# Patient Record
Sex: Male | Born: 1980 | Race: Black or African American | Hispanic: No | Marital: Single | Smoking: Never smoker
Health system: Southern US, Community
[De-identification: ages and names within clinical notes are randomized; demographics above are authoritative.]

## PROBLEM LIST (undated history)

## (undated) DIAGNOSIS — A549 Gonococcal infection, unspecified: Secondary | ICD-10-CM

## (undated) DIAGNOSIS — J45909 Unspecified asthma, uncomplicated: Secondary | ICD-10-CM

---

## 2005-01-08 ENCOUNTER — Emergency Department (HOSPITAL_COMMUNITY): Admission: EM | Admit: 2005-01-08 | Discharge: 2005-01-08 | Payer: Self-pay | Admitting: Emergency Medicine

## 2010-03-18 ENCOUNTER — Emergency Department (HOSPITAL_COMMUNITY): Admission: EM | Admit: 2010-03-18 | Discharge: 2010-03-18 | Payer: Self-pay | Admitting: Family Medicine

## 2010-08-31 LAB — GC/CHLAMYDIA PROBE AMP, GENITAL: GC Probe Amp, Genital: POSITIVE — AB

## 2012-07-04 ENCOUNTER — Encounter (HOSPITAL_COMMUNITY): Payer: Self-pay | Admitting: Emergency Medicine

## 2012-07-04 ENCOUNTER — Emergency Department (INDEPENDENT_AMBULATORY_CARE_PROVIDER_SITE_OTHER)
Admission: EM | Admit: 2012-07-04 | Discharge: 2012-07-04 | Disposition: A | Discharge status: Home / Self Care | Payer: Self-pay | Source: Home / Self Care | Attending: Family Medicine | Admitting: Family Medicine

## 2012-07-04 ENCOUNTER — Other Ambulatory Visit (HOSPITAL_COMMUNITY)
Admission: RE | Admit: 2012-07-04 | Discharge: 2012-07-04 | Disposition: A | Discharge status: Home / Self Care | Payer: Self-pay | Source: Ambulatory Visit | Attending: Family Medicine | Admitting: Family Medicine

## 2012-07-04 DIAGNOSIS — Z113 Encounter for screening for infections with a predominantly sexual mode of transmission: Secondary | ICD-10-CM | POA: Insufficient documentation

## 2012-07-04 DIAGNOSIS — A64 Unspecified sexually transmitted disease: Secondary | ICD-10-CM

## 2012-07-04 HISTORY — DX: Unspecified asthma, uncomplicated: J45.909

## 2012-07-04 HISTORY — DX: Gonococcal infection, unspecified: A54.9

## 2012-07-04 LAB — POCT URINALYSIS DIP (DEVICE)
Bilirubin Urine: NEGATIVE
Glucose, UA: NEGATIVE mg/dL
Hgb urine dipstick: NEGATIVE
Ketones, ur: NEGATIVE mg/dL
Leukocytes, UA: NEGATIVE
pH: 6.5 (ref 5.0–8.0)

## 2012-07-04 MED ORDER — CEFTRIAXONE SODIUM 250 MG IJ SOLR
INTRAMUSCULAR | Status: AC
  Filled 2012-07-04: qty 250

## 2012-07-04 MED ORDER — CEFTRIAXONE SODIUM 1 G IJ SOLR
250.0000 mg | Freq: Once | INTRAMUSCULAR | Status: AC
  Administered 2012-07-04: 250 mg via INTRAMUSCULAR

## 2012-07-04 MED ORDER — LIDOCAINE HCL (PF) 1 % IJ SOLN
INTRAMUSCULAR | Status: AC
  Filled 2012-07-04: qty 5

## 2012-07-04 MED ORDER — AZITHROMYCIN 250 MG PO TABS
1000.0000 mg | ORAL_TABLET | Freq: Once | ORAL | Status: AC
  Administered 2012-07-04: 1000 mg via ORAL

## 2012-07-04 MED ORDER — AZITHROMYCIN 250 MG PO TABS
ORAL_TABLET | ORAL | Status: AC
  Filled 2012-07-04: qty 4

## 2012-07-04 NOTE — ED Notes (Signed)
Reports tingling with urination, denies sores or blisters.  Patient reports history of gonorrhea.

## 2012-07-04 NOTE — ED Provider Notes (Signed)
History     CSN: [id number]  Arrival date & time 07/04/12  1044   First MD Initiated Contact with Patient 07/04/12 1123      Chief Complaint  Patient presents with  . Exposure to STD    (Consider location/radiation/quality/duration/timing/severity/associated sxs/prior treatment) Patient is a 32 y.o. male presenting with STD exposure. The history is provided by the patient.  Exposure to STD This is a new problem. The current episode started yesterday (exposure approx 1 week ago, no condom use.). The problem has been gradually worsening (state sx and progression similar to gc episode he has had prev.). Pertinent negatives include no abdominal pain.    Past Medical History  Diagnosis Date  . Gonorrhea   . Asthma     History reviewed. No pertinent past surgical history.  No family history on file.  History  Substance Use Topics  . Smoking status: Never Smoker   . Smokeless tobacco: Not on file  . Alcohol Use: Yes      Review of Systems  Constitutional: Negative.   Gastrointestinal: Negative.  Negative for abdominal pain.  Genitourinary: Positive for dysuria. Negative for discharge, penile swelling, scrotal swelling, penile pain and testicular pain.    Allergies  Review of patient's allergies indicates no known allergies.  Home Medications  No current outpatient prescriptions on file.  BP 145/95  Pulse 80  Temp 98.6 F (37 C) (Oral)  Resp 18  SpO2 99%  Physical Exam  Nursing note and vitals reviewed. Constitutional: He is oriented to person, place, and time. He appears well-developed and well-nourished.  Abdominal: Soft. Bowel sounds are normal.  Genitourinary: Penis normal. No penile tenderness.  Neurological: He is alert and oriented to person, place, and time.  Skin: Skin is warm and dry.    ED Course  Procedures (including critical care time)   Labs Reviewed  POCT URINALYSIS DIP (DEVICE)  URINE CYTOLOGY ANCILLARY ONLY   No results  found.   1. STD (male)       MDM          Linna Hoff, MD 07/04/12 [id number]

## 2012-07-04 NOTE — ED Notes (Signed)
Patient understands the delay prior to discharge post antibiotic injection

## 2012-07-30 ENCOUNTER — Telehealth (HOSPITAL_COMMUNITY): Payer: Self-pay | Admitting: *Deleted

## 2012-07-30 NOTE — ED Notes (Addendum)
GC neg., Chlamydia pos., Affirm: Trich and BV neg.,   Pt. adequately treated with Zithromax and Rocephin.  I called pt. and left a message. He called back.  Pt. verified x 2 and given results.  Pt. told he was adequately treated.  Pt. instructed to notify his partner, no sex for 1 week and to practice safe sex. Pt. told they can get HIV testing at the [facility]. STD clinic, by appointment.  Pt. voiced understanding.  DHHS form completed and faxed to the [facility]. Vassie Moselle 07/30/2012

## 2012-08-31 ENCOUNTER — Emergency Department (HOSPITAL_COMMUNITY)
Admission: EM | Admit: 2012-08-31 | Discharge: 2012-08-31 | Disposition: A | Discharge status: Home / Self Care | Payer: BC Managed Care – PPO | Source: Home / Self Care | Attending: Family Medicine | Admitting: Family Medicine

## 2012-08-31 ENCOUNTER — Encounter (HOSPITAL_COMMUNITY): Payer: Self-pay

## 2012-08-31 DIAGNOSIS — A64 Unspecified sexually transmitted disease: Secondary | ICD-10-CM

## 2012-08-31 MED ORDER — CEFTRIAXONE SODIUM 250 MG IJ SOLR
250.0000 mg | Freq: Once | INTRAMUSCULAR | Status: AC
  Administered 2012-08-31: 250 mg via INTRAMUSCULAR

## 2012-08-31 MED ORDER — LIDOCAINE HCL (PF) 1 % IJ SOLN
INTRAMUSCULAR | Status: AC
  Filled 2012-08-31: qty 5

## 2012-08-31 MED ORDER — CEFTRIAXONE SODIUM 250 MG IJ SOLR
INTRAMUSCULAR | Status: AC
  Filled 2012-08-31: qty 250

## 2012-08-31 MED ORDER — DOXYCYCLINE HYCLATE 100 MG PO CAPS: 100.0000 mg | ORAL_CAPSULE | Freq: Two times a day (BID) | ORAL | Status: DC

## 2012-08-31 NOTE — ED Notes (Addendum)
Here for STD check; partner has informed him of chlamydia; here for testing and treatment; NAD

## 2012-08-31 NOTE — ED Provider Notes (Signed)
History     CSN: [id number]  Arrival date & time 08/31/12  1100   First MD Initiated Contact with Patient 08/31/12 1116      Chief Complaint  Patient presents with  . Exposure to STD    HPI patient is a 32 year old male who has been sexually active coming in with some dysuria. Patient was told that one of the sexual partners did have a positive chlamydia. Patient was seen back in February and did have a positive chlamydia. Patient denies any fevers or chills or any abdominal pain. Patient states that he's had some dysuria usually in the mornings and that seems to resolve. Patient denies any type of rash, any penile discharge, any fevers chills or any abnormal weight loss. Patient states that he has got a health department and started and tested for HIV and syphilis which were negative and he would decline a lab draw today.  Past Medical History  Diagnosis Date  . Gonorrhea   . Asthma     History reviewed. No pertinent past surgical history.  History reviewed. No pertinent family history.  History  Substance Use Topics  . Smoking status: Never Smoker   . Smokeless tobacco: Not on file  . Alcohol Use: Yes      Review of Systems  Allergies  Review of patient's allergies indicates no known allergies.  Home Medications   Current Outpatient Rx  Name  Route  Sig  Dispense  Refill  . doxycycline (VIBRAMYCIN) 100 MG capsule   Oral   Take 1 capsule (100 mg total) by mouth 2 (two) times daily.   28 capsule   0     BP 134/84  Pulse 90  Temp(Src) 98.6 F (37 C) (Oral)  Resp 19  SpO2 100%  Physical Exam General: No apparent distress alert and oriented x3 mood and affect normal Respiratory: Patient's speak in full sentences and does not appear short of breath Skin: Warm dry intact with no signs of infection or rash Neuro: Cranial nerves II through XII are intact, neurovascularly intact in all extremities with 2+ DTRs and 2+ pulses. General exam: No gross deformity,  testes are nontender, there is no swelling. No penile discharge. No enlarged lymph nodes. ED Course  Procedures   Labs Reviewed  URINALYSIS, DIPSTICK ONLY  URINE CYTOLOGY ANCILLARY ONLY  URINE CYTOLOGY ANCILLARY ONLY   No results found.   1. STD (male)    patient given an injection of ceftriaxone today and we'll give doxycycline. We will await labs and call him with the results as well. Patient's decline any blood draw. Patient will follow up as needed. Discussed at length about safe sex practices.    MDM          Judi Saa, DO 08/31/12 1135

## 2012-08-31 NOTE — ED Notes (Signed)
UA testing cancelled due to pt UA amt produced

## 2012-08-31 NOTE — ED Provider Notes (Signed)
Medical screening examination/treatment/procedure(s) were performed by a resident physician and as supervising physician I was immediately available for consultation/collaboration.  Margaree Sandhu, M.D.  Vianka Ertel C Maaliyah Adolph, MD 08/31/12 2039 

## 2013-04-01 ENCOUNTER — Emergency Department (HOSPITAL_COMMUNITY)
Admission: EM | Admit: 2013-04-01 | Discharge: 2013-04-01 | Disposition: A | Discharge status: Home / Self Care | Payer: BC Managed Care – PPO | Source: Home / Self Care

## 2013-04-01 ENCOUNTER — Other Ambulatory Visit (HOSPITAL_COMMUNITY)
Admission: RE | Admit: 2013-04-01 | Discharge: 2013-04-01 | Disposition: A | Discharge status: Home / Self Care | Payer: BC Managed Care – PPO | Source: Ambulatory Visit | Attending: Family Medicine | Admitting: Family Medicine

## 2013-04-01 ENCOUNTER — Encounter (HOSPITAL_COMMUNITY): Payer: Self-pay | Admitting: Emergency Medicine

## 2013-04-01 DIAGNOSIS — R1030 Lower abdominal pain, unspecified: Secondary | ICD-10-CM

## 2013-04-01 DIAGNOSIS — Z202 Contact with and (suspected) exposure to infections with a predominantly sexual mode of transmission: Secondary | ICD-10-CM

## 2013-04-01 DIAGNOSIS — R109 Unspecified abdominal pain: Secondary | ICD-10-CM

## 2013-04-01 DIAGNOSIS — Z113 Encounter for screening for infections with a predominantly sexual mode of transmission: Secondary | ICD-10-CM | POA: Insufficient documentation

## 2013-04-01 MED ORDER — CEFTRIAXONE SODIUM 250 MG IJ SOLR
INTRAMUSCULAR | Status: AC
  Filled 2013-04-01: qty 250

## 2013-04-01 MED ORDER — AZITHROMYCIN 250 MG PO TABS
ORAL_TABLET | ORAL | Status: AC
  Filled 2013-04-01: qty 4

## 2013-04-01 MED ORDER — CEFTRIAXONE SODIUM 250 MG IJ SOLR
250.0000 mg | Freq: Once | INTRAMUSCULAR | Status: AC
  Administered 2013-04-01: 250 mg via INTRAMUSCULAR

## 2013-04-01 MED ORDER — DOXYCYCLINE HYCLATE 100 MG PO CAPS: 100.0000 mg | ORAL_CAPSULE | Freq: Two times a day (BID) | ORAL | Status: DC

## 2013-04-01 MED ORDER — AZITHROMYCIN 250 MG PO TABS
1000.0000 mg | ORAL_TABLET | Freq: Every day | ORAL | Status: DC
  Administered 2013-04-01: 1000 mg via ORAL

## 2013-04-01 NOTE — ED Provider Notes (Signed)
CSN: [id number]     Arrival date & time 04/01/13  4098 History   First MD Initiated Contact with Patient 04/01/13 1008     Chief Complaint  Patient presents with  . Groin Pain   (Consider location/radiation/quality/duration/timing/severity/associated sxs/prior Treatment) HPI Comments: 32 year old male complaining of suprapubic discomfort associated with urination off and on for several months. Has a history of STDs to include Chlamydia and gonorrhea. He has been to the urgent care 4 times for similar complaints. Rarely has dysuria but the other only description is that he feels hot in the groin. He is sexually active and sometimes uses a condom and others not. Denies systemic symptoms.   Past Medical History  Diagnosis Date  . Gonorrhea   . Asthma    History reviewed. No pertinent past surgical history. History reviewed. No pertinent family history. History  Substance Use Topics  . Smoking status: Never Smoker   . Smokeless tobacco: Not on file  . Alcohol Use: Yes    Review of Systems  Constitutional: Negative.   Gastrointestinal: Negative.   Genitourinary: Positive for dysuria. Negative for urgency, frequency, hematuria, flank pain, decreased urine volume, discharge, scrotal swelling, difficulty urinating, genital sores, penile pain and testicular pain.  Skin: Negative for rash.  Neurological: Negative.     Allergies  Review of patient's allergies indicates no known allergies.  Home Medications   Current Outpatient Rx  Name  Route  Sig  Dispense  Refill  . doxycycline (VIBRAMYCIN) 100 MG capsule   Oral   Take 1 capsule (100 mg total) by mouth 2 (two) times daily.   28 capsule   0   . doxycycline (VIBRAMYCIN) 100 MG capsule   Oral   Take 1 capsule (100 mg total) by mouth 2 (two) times daily.   20 capsule   0    BP 137/84  Pulse 81  Temp(Src) 99.1 F (37.3 C) (Oral)  Resp 18  SpO2 99% Physical Exam  Nursing note and vitals reviewed. Constitutional: He is  oriented to person, place, and time. He appears well-developed and well-nourished. No distress.  Neck: Normal range of motion. Neck supple.  Pulmonary/Chest: Effort normal. No respiratory distress.  Genitourinary: Penis normal. No penile tenderness.  Normal external male genitalia. Normal phallus. No penile discharge. Bilateral testicles descended, symmetric and of normal size. No tenderness. No epididymal tenderness or swelling. No tender or enlarged inguinal lymph nodes. No visible genital skin lesions.  Neurological: He is alert and oriented to person, place, and time. He exhibits normal muscle tone.  Skin: Skin is warm and dry. No rash noted.  Psychiatric: He has a normal mood and affect.    ED Course  Procedures (including critical care time) Labs Review Labs Reviewed  URINE CYTOLOGY ANCILLARY ONLY   Imaging Review No results found.    MDM   1. Suprapubic pain, unspecified laterality   2. Possible exposure to STD      Recommend you obtain a primary care provider as it is possible. He may also followup with the health department. Rocephin 250 mg IM Azithromycin 1 g by mouth now Doxycycline 100 mg twice a day for 7 days Instructions on STD and prevention.  Hayden Rasmussen, NP 04/01/13 1124

## 2013-04-01 NOTE — ED Notes (Signed)
Pt  Reports  Pain in his  Groin area   And  Low  abd  X  2   Months   He   Ambulated  With a  Steady  gluid  Gait    He  denys  Any  Discharge  Or  Any  Lesions            He reports  He  Has       Had  Std  In  Past

## 2013-04-03 NOTE — ED Provider Notes (Signed)
Medical screening examination/treatment/procedure(s) were performed by a resident physician or non-physician practitioner and as the supervising physician I was immediately available for consultation/collaboration.  Clementeen Graham, MD   Rodolph Bong, MD 04/03/13 [phone]

## 2014-03-20 ENCOUNTER — Emergency Department (HOSPITAL_COMMUNITY)
Admission: EM | Admit: 2014-03-20 | Discharge: 2014-03-20 | Disposition: A | Discharge status: Home / Self Care | Payer: BC Managed Care – PPO | Source: Home / Self Care | Attending: Family Medicine | Admitting: Family Medicine

## 2014-03-20 ENCOUNTER — Encounter (HOSPITAL_COMMUNITY): Payer: Self-pay | Admitting: Emergency Medicine

## 2014-03-20 DIAGNOSIS — H109 Unspecified conjunctivitis: Secondary | ICD-10-CM

## 2014-03-20 DIAGNOSIS — H5711 Ocular pain, right eye: Secondary | ICD-10-CM

## 2014-03-20 MED ORDER — POLYMYXIN B-TRIMETHOPRIM 10000-0.1 UNIT/ML-% OP SOLN: 2.0000 [drp] | Freq: Four times a day (QID) | OPHTHALMIC | Status: AC

## 2014-03-20 NOTE — Discharge Instructions (Signed)
Bacterial Conjunctivitis °Bacterial conjunctivitis, commonly called pink eye, is an inflammation of the clear membrane that covers the white part of the eye (conjunctiva). The inflammation can also happen on the underside of the eyelids. The blood vessels in the conjunctiva become inflamed, causing the eye to become red or pink. Bacterial conjunctivitis may spread easily from one eye to another and from person to person (contagious).  °CAUSES  °Bacterial conjunctivitis is caused by bacteria. The bacteria may come from your own skin, your upper respiratory tract, or from someone else with bacterial conjunctivitis. °SYMPTOMS  °The normally white color of the eye or the underside of the eyelid is usually pink or red. The pink eye is usually associated with irritation, tearing, and some sensitivity to light. Bacterial conjunctivitis is often associated with a thick, yellowish discharge from the eye. The discharge may turn into a crust on the eyelids overnight, which causes your eyelids to stick together. If a discharge is present, there may also be some blurred vision in the affected eye. °DIAGNOSIS  °Bacterial conjunctivitis is diagnosed by your caregiver through an eye exam and the symptoms that you report. Your caregiver looks for changes in the surface tissues of your eyes, which may point to the specific type of conjunctivitis. A sample of any discharge may be collected on a cotton-tip swab if you have a severe case of conjunctivitis, if your cornea is affected, or if you keep getting repeat infections that do not respond to treatment. The sample will be sent to a lab to see if the inflammation is caused by a bacterial infection and to see if the infection will respond to antibiotic medicines. °TREATMENT  °· Bacterial conjunctivitis is treated with antibiotics. Antibiotic eyedrops are most often used. However, antibiotic ointments are also available. Antibiotics pills are sometimes used. Artificial tears or eye  washes may ease discomfort. °HOME CARE INSTRUCTIONS  °· To ease discomfort, apply a cool, clean washcloth to your eye for 10-20 minutes, 3-4 times a day. °· Gently wipe away any drainage from your eye with a warm, wet washcloth or a cotton ball. °· Wash your hands often with soap and water. Use paper towels to dry your hands. °· Do not share towels or washcloths. This may spread the infection. °· Change or wash your pillowcase every day. °· You should not use eye makeup until the infection is gone. °· Do not operate machinery or drive if your vision is blurred. °· Stop using contact lenses. Ask your caregiver how to sterilize or replace your contacts before using them again. This depends on the type of contact lenses that you use. °· When applying medicine to the infected eye, do not touch the edge of your eyelid with the eyedrop bottle or ointment tube. °SEEK IMMEDIATE MEDICAL CARE IF:  °· Your infection has not improved within 3 days after beginning treatment. °· You had yellow discharge from your eye and it returns. °· You have increased eye pain. °· Your eye redness is spreading. °· Your vision becomes blurred. °· You have a fever or persistent symptoms for more than 2-3 days. °· You have a fever and your symptoms suddenly get worse. °· You have facial pain, redness, or swelling. °MAKE SURE YOU:  °· Understand these instructions. °· Will watch your condition. °· Will get help right away if you are not doing well or get worse. °Document Released: 06/04/2005 Document Revised: 10/19/2013 Document Reviewed: 11/05/2011 °ExitCare® Patient Information ©2015 [employer]. This information is not intended to   replace advice given to you by your health care provider. Make sure you discuss any questions you have with your health care provider.   Use 1 day longer after symptoms improve. It is possible it may occur in left eye, ok to use in that eye if needed.

## 2014-03-20 NOTE — ED Provider Notes (Signed)
CSN: [id number]     Arrival date & time 03/20/14  1102 History   First MD Initiated Contact with Patient 03/20/14 1112     Chief Complaint  Patient presents with  . Conjunctivitis   (Consider location/radiation/quality/duration/timing/severity/associated sxs/prior Treatment) HPI Comments: Patient presents with a red irritated eye that began yesterday. His Children's Mom also notes this. HE denies change in vision, cold symptoms, fever or chills. Matted together this am.   Patient is a 33 y.o. male presenting with conjunctivitis. The history is provided by the patient.  Conjunctivitis    Past Medical History  Diagnosis Date  . Gonorrhea   . Asthma     as a child   History reviewed. No pertinent past surgical history. Family History  Problem Relation Age of Onset  . Diabetes Father   . Diabetes Other    History  Substance Use Topics  . Smoking status: Never Smoker   . Smokeless tobacco: Not on file  . Alcohol Use: Yes     Comment: 50 oz /day    Review of Systems  All other systems reviewed and are negative.   Allergies  Review of patient's allergies indicates no known allergies.  Home Medications   Prior to Admission medications   Medication Sig Start Date End Date Taking? Authorizing Provider  doxycycline (VIBRAMYCIN) 100 MG capsule Take 1 capsule (100 mg total) by mouth 2 (two) times daily. 08/31/12   Judi SaaZachary M Smith, DO  doxycycline (VIBRAMYCIN) 100 MG capsule Take 1 capsule (100 mg total) by mouth 2 (two) times daily. 04/01/13   Hayden Rasmussenavid Mabe, NP  trimethoprim-polymyxin b (POLYTRIM) ophthalmic solution Place 2 drops into the right eye every 6 (six) hours. 03/20/14 03/24/14  Riki SheerMichelle G Benedetto Ryder, PA-C   BP 129/87  Pulse 75  Temp(Src) 98.2 F (36.8 C) (Oral)  Resp 16  SpO2 98% Physical Exam  Nursing note and vitals reviewed. Constitutional: He is oriented to person, place, and time. He appears well-developed and well-nourished. No distress.  HENT:  Head:  Normocephalic and atraumatic.  Mouth/Throat: Oropharynx is clear and moist. No oropharyngeal exudate.  Eyes: Pupils are equal, round, and reactive to light. Right eye exhibits discharge. Left eye exhibits no discharge. No scleral icterus.  Right sclera injection and right beefy red conjunctiva. Clear drainage by exam  Lymphadenopathy:    He has no cervical adenopathy.  Neurological: He is alert and oriented to person, place, and time.  Skin: Skin is warm. No rash noted. He is not diaphoretic.  Psychiatric: His behavior is normal.    ED Course  Procedures (including critical care time) Labs Review Labs Reviewed - No data to display  Imaging Review No results found.   MDM   1. Conjunctivitis of right eye   2. Eye pain, right    Empiric treatment with antibiotic drops given amount of drainage this am. F/U if worsens.     Riki SheerMichelle G Amran Malter, PA-C 03/20/14 1153

## 2014-03-20 NOTE — ED Provider Notes (Signed)
Medical screening examination/treatment/procedure(s) were performed by non-physician practitioner and as supervising physician I was immediately available for consultation/collaboration.  Leslee Homeavid Laketra Bowdish, M.D.  Reuben Likesavid C Cherry Wittwer, MD 03/20/14 [phone]

## 2014-03-20 NOTE — ED Notes (Signed)
C/o pink eye OD onset 10/1. States his children's Mom has it.  Was watery drainage and occasional mild pain. No visual deficit.

## 2014-05-31 ENCOUNTER — Other Ambulatory Visit (HOSPITAL_COMMUNITY)
Admission: RE | Admit: 2014-05-31 | Discharge: 2014-05-31 | Disposition: A | Discharge status: Home / Self Care | Payer: BC Managed Care – PPO | Source: Ambulatory Visit | Attending: Family Medicine | Admitting: Family Medicine

## 2014-05-31 ENCOUNTER — Encounter (HOSPITAL_COMMUNITY): Payer: Self-pay | Admitting: Emergency Medicine

## 2014-05-31 ENCOUNTER — Emergency Department (INDEPENDENT_AMBULATORY_CARE_PROVIDER_SITE_OTHER)
Admission: EM | Admit: 2014-05-31 | Discharge: 2014-05-31 | Disposition: A | Discharge status: Home / Self Care | Payer: BC Managed Care – PPO | Source: Home / Self Care | Attending: Family Medicine | Admitting: Family Medicine

## 2014-05-31 DIAGNOSIS — Z113 Encounter for screening for infections with a predominantly sexual mode of transmission: Secondary | ICD-10-CM | POA: Diagnosis not present

## 2014-05-31 DIAGNOSIS — N342 Other urethritis: Secondary | ICD-10-CM

## 2014-05-31 MED ORDER — CEFTRIAXONE SODIUM 250 MG IJ SOLR
250.0000 mg | Freq: Once | INTRAMUSCULAR | Status: AC
  Administered 2014-05-31: 250 mg via INTRAMUSCULAR

## 2014-05-31 MED ORDER — AZITHROMYCIN 250 MG PO TABS
ORAL_TABLET | ORAL | Status: AC
  Filled 2014-05-31: qty 4

## 2014-05-31 MED ORDER — LIDOCAINE HCL (PF) 1 % IJ SOLN
INTRAMUSCULAR | Status: AC
  Filled 2014-05-31: qty 5

## 2014-05-31 MED ORDER — AZITHROMYCIN 250 MG PO TABS
1000.0000 mg | ORAL_TABLET | Freq: Once | ORAL | Status: AC
  Administered 2014-05-31: 1000 mg via ORAL

## 2014-05-31 MED ORDER — CEFTRIAXONE SODIUM 250 MG IJ SOLR
INTRAMUSCULAR | Status: AC
  Filled 2014-05-31: qty 250

## 2014-05-31 NOTE — Discharge Instructions (Signed)
Thank you for coming in today. ° °Urethritis °Urethritis is an inflammation of the tube through which urine exits your bladder (urethra).  °CAUSES °Urethritis is often caused by an infection in your urethra. The infection can be viral, like herpes. The infection can also be bacterial, like gonorrhea. °RISK FACTORS °Risk factors of urethritis include: °· Having sex without using a condom. °· Having multiple sexual partners. °· Having poor hygiene. °SIGNS AND SYMPTOMS °Symptoms of urethritis are less noticeable in women than in men. These symptoms include: °· Burning feeling when you urinate (dysuria). °· Discharge from your urethra. °· Blood in your urine (hematuria). °· Urinating more than usual. °DIAGNOSIS  °To confirm a diagnosis of urethritis, your health care provider will do the following: °· Ask about your sexual history. °· Perform a physical exam. °· Have you provide a sample of your urine for lab testing. °· Use a cotton swab to gently collect a sample from your urethra for lab testing. °TREATMENT  °It is important to treat urethritis. Depending on the cause, untreated urethritis may lead to serious genital infections and possibly infertility. Urethritis caused by a bacterial infection is treated with antibiotic medicine. All sexual partners must be treated.  °HOME CARE INSTRUCTIONS °· Do not have sex until the test results are known and treatment is completed, even if your symptoms go away before you finish treatment. °· If you were prescribed an antibiotic, finish it all even if you start to feel better. °SEEK MEDICAL CARE IF:  °· Your symptoms are not improved in 3 days. °· Your symptoms are getting worse. °· You develop abdominal pain or pelvic pain (in women). °· You develop joint pain. °· You have a fever. °SEEK IMMEDIATE MEDICAL CARE IF:  °· You have severe pain in the belly, back, or side. °· You have repeated vomiting. °MAKE SURE YOU: °· Understand these instructions. °· Will watch your  condition. °· Will get help right away if you are not doing well or get worse. °Document Released: 11/28/2000 Document Revised: 10/19/2013 Document Reviewed: 02/02/2013 °ExitCare® Patient Information ©2015 [employer]. This information is not intended to replace advice given to you by your health care provider. Make sure you discuss any questions you have with your health care provider. ° °

## 2014-05-31 NOTE — ED Provider Notes (Signed)
Adam Cross is a 33 y.o. male who presents to Urgent Care today for one day of urinary burning and tingling associated with penile discharge. Symptoms consistent with previous episodes of sexually-transmitted infection. No fevers or chills vomiting or diarrhea.   Past Medical History  Diagnosis Date  . Gonorrhea   . Asthma     as a child   History reviewed. No pertinent past surgical history. History  Substance Use Topics  . Smoking status: Never Smoker   . Smokeless tobacco: Not on file  . Alcohol Use: Yes     Comment: 75 oz /day   ROS as above Medications: Current Facility-Administered Medications  Medication Dose Route Frequency Provider Last Rate Last Dose  . azithromycin (ZITHROMAX) tablet 1,000 mg  1,000 mg Oral Once Rodolph BongEvan S Marvie Calender, MD      . cefTRIAXone (ROCEPHIN) injection 250 mg  250 mg Intramuscular Once Rodolph BongEvan S Anatasia Tino, MD       No current outpatient prescriptions on file.   No Known Allergies   Exam:  BP 117/88 mmHg  Pulse 89  Temp(Src) 97.9 F (36.6 C) (Oral)  Resp 16  SpO2 99% Gen: Well NAD Genitals:  No inguinal lymphadenopathy. Testicles are descended bilaterally and nontender. No masses. Normal-appearing circumcised penis with clear discharge.  No results found for this or any previous visit (from the past 24 hour(s)). No results found.  Assessment and Plan: 33 y.o. male with urethritis. Treatment with ceftriaxone and azithromycin. Use 1 g azithromycin orally now, and 250 mg of IM ceftriaxone now.  Urine cytology, HIV and RPR pending.  Discussed warning signs or symptoms. Please see discharge instructions. Patient expresses understanding.     Rodolph BongEvan S Janay Canan, MD 05/31/14 [phone]

## 2014-05-31 NOTE — ED Notes (Signed)
Pt states that it hurts for him to urinate. He feels burning and tingling during work.

## 2014-06-01 LAB — RPR

## 2014-06-01 LAB — URINE CYTOLOGY ANCILLARY ONLY
CHLAMYDIA, DNA PROBE: POSITIVE — AB
Neisseria Gonorrhea: POSITIVE — AB
Trichomonas: NEGATIVE

## 2014-06-01 LAB — HIV ANTIBODY (ROUTINE TESTING W REFLEX): HIV 1&2 Ab, 4th Generation: NONREACTIVE

## 2014-06-03 ENCOUNTER — Telehealth (HOSPITAL_COMMUNITY): Payer: Self-pay | Admitting: *Deleted

## 2014-06-03 NOTE — ED Notes (Signed)
GC and Chlamydia pos., Trich neg.  I called pt. and left a message and he called back.  Pt. verified x 2 and given results.  Pt. told he was adequately treated with Zithromax and Rocephin.  Pt. instructed to notify his partner, no sex for 1 week and to practice safe sex. Pt. [facility], by appointment. Pt. voiced understanding.   DHHS forms x 2 completed [facility]. Vassie MoselleYork, Erminio Nygard M 06/03/2014

## 2014-07-16 ENCOUNTER — Encounter (HOSPITAL_COMMUNITY): Payer: Self-pay | Admitting: Emergency Medicine

## 2014-07-16 ENCOUNTER — Emergency Department (INDEPENDENT_AMBULATORY_CARE_PROVIDER_SITE_OTHER)
Admission: EM | Admit: 2014-07-16 | Discharge: 2014-07-16 | Disposition: A | Discharge status: Home / Self Care | Payer: Self-pay | Source: Home / Self Care | Attending: Family Medicine | Admitting: Family Medicine

## 2014-07-16 ENCOUNTER — Other Ambulatory Visit (HOSPITAL_COMMUNITY)
Admission: RE | Admit: 2014-07-16 | Discharge: 2014-07-16 | Disposition: A | Discharge status: Home / Self Care | Payer: Self-pay | Source: Ambulatory Visit | Attending: Family Medicine | Admitting: Family Medicine

## 2014-07-16 DIAGNOSIS — N342 Other urethritis: Secondary | ICD-10-CM

## 2014-07-16 DIAGNOSIS — Z113 Encounter for screening for infections with a predominantly sexual mode of transmission: Secondary | ICD-10-CM | POA: Insufficient documentation

## 2014-07-16 MED ORDER — AZITHROMYCIN 250 MG PO TABS
1000.0000 mg | ORAL_TABLET | Freq: Once | ORAL | Status: AC
  Administered 2014-07-16: 1000 mg via ORAL

## 2014-07-16 MED ORDER — CEFTRIAXONE SODIUM 250 MG IJ SOLR
INTRAMUSCULAR | Status: AC
  Filled 2014-07-16: qty 250

## 2014-07-16 MED ORDER — AZITHROMYCIN 250 MG PO TABS
ORAL_TABLET | ORAL | Status: AC
  Filled 2014-07-16: qty 1

## 2014-07-16 MED ORDER — CEFTRIAXONE SODIUM 250 MG IJ SOLR
250.0000 mg | Freq: Once | INTRAMUSCULAR | Status: AC
  Administered 2014-07-16: 250 mg via INTRAMUSCULAR

## 2014-07-16 MED ORDER — LIDOCAINE HCL (PF) 1 % IJ SOLN
INTRAMUSCULAR | Status: AC
  Filled 2014-07-16: qty 5

## 2014-07-16 NOTE — ED Provider Notes (Signed)
Blane OharaJason Lett is a 34 y.o. male who presents to Urgent Care today for urethritis. Patient was seen about 6 weeks ago for penile discharge and pain. He was diagnosed with urethritis and given empiric ceftriaxone and azithromycin. He vomited the azithromycin when he left. Since then his symptoms as persisted. He is here today for retest and retreat.   Past Medical History  Diagnosis Date  . Gonorrhea   . Asthma     as a child   History reviewed. No pertinent past surgical history. History  Substance Use Topics  . Smoking status: Never Smoker   . Smokeless tobacco: Not on file  . Alcohol Use: Yes     Comment: 75 oz /day   ROS as above Medications: No current facility-administered medications for this encounter.   No current outpatient prescriptions on file.   No Known Allergies   Exam:  BP 136/94 mmHg  Pulse 100  Temp(Src) 98 F (36.7 C) (Oral)  Resp 16  SpO2 100% Gen: Well NAD Genitals: No inguinal lymphadenopathy. Testicles are normal and descended bilaterally and nontender with no masses. Penis is normal-appearing with no masses. Clear discharge present.   No results found for this or any previous visit (from the past 24 hour(s)). No results found.  Assessment and Plan: 34 y.o. male with urethritis. Cytology pending. Treat with azithromycin and ceftriaxone. Call if vomiting second dose. Test of cure at health department recommended.  Discussed warning signs or symptoms. Please see discharge instructions. Patient expresses understanding.     Rodolph BongEvan S Shakeisha Horine, MD 07/16/14 2031

## 2014-07-16 NOTE — ED Notes (Signed)
Pt reports he was treated for Gc/Chlamydia on 12/14 but he vomited pills given to him??? States he did not come back earlier b/c he was feeling better but his urine has been a cloudy color Alert, no signs of acute distress.

## 2014-07-16 NOTE — ED Notes (Signed)
Call back number verified.  

## 2014-07-16 NOTE — Discharge Instructions (Signed)
Thank you for coming in today. I recommend that he return for test of cure in about 2 weeks. Call if you vomit this medicine   Urethritis Urethritis is an inflammation of the tube through which urine exits your bladder (urethra).  CAUSES Urethritis is often caused by an infection in your urethra. The infection can be viral, like herpes. The infection can also be bacterial, like gonorrhea. RISK FACTORS Risk factors of urethritis include:  Having sex without using a condom.  Having multiple sexual partners.  Having poor hygiene. SIGNS AND SYMPTOMS Symptoms of urethritis are less noticeable in women than in men. These symptoms include:  Burning feeling when you urinate (dysuria).  Discharge from your urethra.  Blood in your urine (hematuria).  Urinating more than usual. DIAGNOSIS  To confirm a diagnosis of urethritis, your health care provider will do the following:  Ask about your sexual history.  Perform a physical exam.  Have you provide a sample of your urine for lab testing.  Use a cotton swab to gently collect a sample from your urethra for lab testing. TREATMENT  It is important to treat urethritis. Depending on the cause, untreated urethritis may lead to serious genital infections and possibly infertility. Urethritis caused by a bacterial infection is treated with antibiotic medicine. All sexual partners must be treated.  HOME CARE INSTRUCTIONS  Do not have sex until the test results are known and treatment is completed, even if your symptoms go away before you finish treatment.  If you were prescribed an antibiotic, finish it all even if you start to feel better. SEEK MEDICAL CARE IF:   Your symptoms are not improved in 3 days.  Your symptoms are getting worse.  You develop abdominal pain or pelvic pain (in women).  You develop joint pain.  You have a fever. SEEK IMMEDIATE MEDICAL CARE IF:   You have severe pain in the belly, back, or side.  You have  repeated vomiting. MAKE SURE YOU:  Understand these instructions.  Will watch your condition.  Will get help right away if you are not doing well or get worse. Document Released: 11/28/2000 Document Revised: 10/19/2013 Document Reviewed: 02/02/2013 [facility] Patient Information 2015 [place], [place]. This information is not intended to replace advice given to you by your health care provider. Make sure you discuss any questions you have with your health care provider.

## 2014-07-19 LAB — URINE CYTOLOGY ANCILLARY ONLY
Chlamydia: NEGATIVE
NEISSERIA GONORRHEA: NEGATIVE
Trichomonas: NEGATIVE

## 2014-11-23 ENCOUNTER — Encounter (HOSPITAL_COMMUNITY): Payer: Self-pay | Admitting: Emergency Medicine

## 2014-11-23 ENCOUNTER — Emergency Department (HOSPITAL_COMMUNITY): Payer: BLUE CROSS/BLUE SHIELD

## 2014-11-23 ENCOUNTER — Emergency Department (HOSPITAL_COMMUNITY)
Admission: EM | Admit: 2014-11-23 | Discharge: 2014-11-23 | Disposition: A | Discharge status: Home / Self Care | Payer: BLUE CROSS/BLUE SHIELD | Attending: Emergency Medicine | Admitting: Emergency Medicine

## 2014-11-23 DIAGNOSIS — S56424A Laceration of extensor muscle, fascia and tendon of left middle finger at forearm level, initial encounter: Secondary | ICD-10-CM | POA: Diagnosis not present

## 2014-11-23 DIAGNOSIS — Z8619 Personal history of other infectious and parasitic diseases: Secondary | ICD-10-CM | POA: Diagnosis not present

## 2014-11-23 DIAGNOSIS — J45909 Unspecified asthma, uncomplicated: Secondary | ICD-10-CM | POA: Insufficient documentation

## 2014-11-23 DIAGNOSIS — S60312A Abrasion of left thumb, initial encounter: Secondary | ICD-10-CM | POA: Diagnosis not present

## 2014-11-23 DIAGNOSIS — Y929 Unspecified place or not applicable: Secondary | ICD-10-CM | POA: Diagnosis not present

## 2014-11-23 DIAGNOSIS — S56426A Laceration of extensor muscle, fascia and tendon of left ring finger at forearm level, initial encounter: Secondary | ICD-10-CM | POA: Diagnosis not present

## 2014-11-23 DIAGNOSIS — S51812A Laceration without foreign body of left forearm, initial encounter: Secondary | ICD-10-CM | POA: Diagnosis not present

## 2014-11-23 DIAGNOSIS — IMO0002 Reserved for concepts with insufficient information to code with codable children: Secondary | ICD-10-CM

## 2014-11-23 DIAGNOSIS — Y939 Activity, unspecified: Secondary | ICD-10-CM | POA: Diagnosis not present

## 2014-11-23 DIAGNOSIS — S0011XA Contusion of right eyelid and periocular area, initial encounter: Secondary | ICD-10-CM | POA: Diagnosis not present

## 2014-11-23 DIAGNOSIS — S0083XA Contusion of other part of head, initial encounter: Secondary | ICD-10-CM | POA: Diagnosis not present

## 2014-11-23 DIAGNOSIS — Z23 Encounter for immunization: Secondary | ICD-10-CM | POA: Insufficient documentation

## 2014-11-23 DIAGNOSIS — Y999 Unspecified external cause status: Secondary | ICD-10-CM | POA: Insufficient documentation

## 2014-11-23 DIAGNOSIS — S59912A Unspecified injury of left forearm, initial encounter: Secondary | ICD-10-CM | POA: Diagnosis present

## 2014-11-23 MED ORDER — LIDOCAINE-EPINEPHRINE 1 %-1:100000 IJ SOLN
10.0000 mL | Freq: Once | INTRAMUSCULAR | Status: AC
  Administered 2014-11-23: 10 mL via INTRADERMAL
  Filled 2014-11-23: qty 1

## 2014-11-23 MED ORDER — TETANUS-DIPHTH-ACELL PERTUSSIS 5-2.5-18.5 LF-MCG/0.5 IM SUSP
0.5000 mL | Freq: Once | INTRAMUSCULAR | Status: AC
  Administered 2014-11-23: 0.5 mL via INTRAMUSCULAR
  Filled 2014-11-23: qty 0.5

## 2014-11-23 MED ORDER — AMOXICILLIN-POT CLAVULANATE 875-125 MG PO TABS
1.0000 | ORAL_TABLET | Freq: Once | ORAL | Status: AC
  Administered 2014-11-23: 1 via ORAL
  Filled 2014-11-23: qty 1

## 2014-11-23 MED ORDER — AMOXICILLIN-POT CLAVULANATE 875-125 MG PO TABS
1.0000 | ORAL_TABLET | Freq: Two times a day (BID) | ORAL | Status: DC
Start: 2014-11-23 — End: 2019-10-23
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
## 2014-11-23 NOTE — Discharge Instructions (Signed)
Laceration Care, Adult Adam Cross, you have a laceration through the tendon. You need to see the hand surgeon today, June 7 in clinic. It is very important that he can lose the function of the finger. Take antibiotics as prescribed. See primary care physician within 3 days for wound check. He need to have his sutures reevaluated within 1 week or they may become infected. If any symptoms worsen come back to the emergency department immediately. Thank you. A laceration is a cut that goes through all layers of the skin. The cut goes into the tissue beneath the skin. HOME CARE For stitches (sutures) or staples:  Keep the cut clean and dry.  If you have a bandage (dressing), change it at least once a day. Change the bandage if it gets wet or dirty, or as told by your doctor.  Wash the cut with soap and water 2 times a day. Rinse the cut with water. Pat it dry with a clean towel.  Put a thin layer of medicated cream on the cut as told by your doctor.  You may shower after the first 24 hours. Do not soak the cut in water until the stitches are removed.  Only take medicines as told by your doctor.  Have your stitches or staples removed as told by your doctor. For skin adhesive strips:  Keep the cut clean and dry.  Do not get the strips wet. You may take a bath, but be careful to keep the cut dry.  If the cut gets wet, pat it dry with a clean towel.  The strips will fall off on their own. Do not remove the strips that are still stuck to the cut. For wound glue:  You may shower or take baths. Do not soak or scrub the cut. Do not swim. Avoid heavy sweating until the glue falls off on its own. After a shower or bath, pat the cut dry with a clean towel.  Do not put medicine on your cut until the glue falls off.  If you have a bandage, do not put tape over the glue.  Avoid lots of sunlight or tanning lamps until the glue falls off. Put sunscreen on the cut for the first year to reduce your  scar.  The glue will fall off on its own. Do not pick at the glue. You may need a tetanus shot if:  You cannot remember when you had your last tetanus shot.  You have never had a tetanus shot. If you need a tetanus shot and you choose not to have one, you may get tetanus. Sickness from tetanus can be serious. GET HELP RIGHT AWAY IF:   Your pain does not get better with medicine.  Your arm, hand, leg, or foot loses feeling (numbness) or changes color.  Your cut is bleeding.  Your joint feels weak, or you cannot use your joint.  You have painful lumps on your body.  Your cut is red, puffy (swollen), or painful.  You have a red line on the skin near the cut.  You have yellowish-white fluid (pus) coming from the cut.  You have a fever.  You have a bad smell coming from the cut or bandage.  Your cut breaks open before or after stitches are removed.  You notice something coming out of the cut, such as wood or glass.  You cannot move a finger or toe. MAKE SURE YOU:   Understand these instructions.  Will watch your condition.  Will  get help right away if you are not doing well or get worse. Document Released: 11/21/2007 Document Revised: 08/27/2011 Document Reviewed: 11/28/2010 [facility]Exit[facility] Patient Information 2015 [place]Care, [place]. This information is not intended to replace advice given to you by your health care provider. Make sure you discuss any questions you have with your health care provider. Tendon Injury Tendons are strong, cordlike structures that connect muscle to bone. Tendons are made up of woven fibers, like a rope. A tendon injury is a tear (rupture) of the tendon. The rupture may be partial (only a few of the fibers in your tendon rupture) or complete (your entire tendon ruptures). CAUSES  Tendon injuries can be caused by high-stress activities, such as sports. They also can be caused by a repetitive injury or by a single injury from an excessive, rapid  force. SYMPTOMS  Symptoms of tendon injury include pain when you move the joint close to the tendon. Other symptoms are swelling, redness, and warmth. DIAGNOSIS  Tendon injuries often can be diagnosed by physical exam. However, sometimes an X-ray exam or advanced imaging, such as magnetic resonance imaging (MRI), is necessary to determine the extent of the injury. TREATMENT  Partial tendon ruptures often can be treated with immobilization. A splint, bandage, or removable brace usually is used to immobilize the injured tendon. Most injured tendons need to be immobilized for 1-2 months before they are completely healed. Complete tendon ruptures may require surgical reattachment. Document Released: 07/12/2004 Document Revised: 05/24/2011 Document Reviewed: 08/26/2011 [facility]ExitCare Patient Information 2015 [place]ExitCare, [place]LL[place]. This information is not intended to replace advice given to you by your health care provider. Make sure you discuss any questions you have with your health care provider.
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
## 2015-08-22 ENCOUNTER — Emergency Department (HOSPITAL_COMMUNITY): Payer: BLUE CROSS/BLUE SHIELD

## 2015-08-22 ENCOUNTER — Emergency Department (HOSPITAL_COMMUNITY)
Admission: EM | Admit: 2015-08-22 | Discharge: 2015-08-23 | Disposition: A | Discharge status: Home / Self Care | Payer: BLUE CROSS/BLUE SHIELD | Attending: Emergency Medicine | Admitting: Emergency Medicine

## 2015-08-22 ENCOUNTER — Encounter (HOSPITAL_COMMUNITY): Payer: Self-pay | Admitting: Emergency Medicine

## 2015-08-22 DIAGNOSIS — Y9389 Activity, other specified: Secondary | ICD-10-CM | POA: Insufficient documentation

## 2015-08-22 DIAGNOSIS — S61402A Unspecified open wound of left hand, initial encounter: Secondary | ICD-10-CM | POA: Insufficient documentation

## 2015-08-22 DIAGNOSIS — S41001A Unspecified open wound of right shoulder, initial encounter: Secondary | ICD-10-CM | POA: Insufficient documentation

## 2015-08-22 DIAGNOSIS — Y999 Unspecified external cause status: Secondary | ICD-10-CM | POA: Diagnosis not present

## 2015-08-22 DIAGNOSIS — W3400XA Accidental discharge from unspecified firearms or gun, initial encounter: Secondary | ICD-10-CM

## 2015-08-22 DIAGNOSIS — S01412A Laceration without foreign body of left cheek and temporomandibular area, initial encounter: Secondary | ICD-10-CM | POA: Insufficient documentation

## 2015-08-22 DIAGNOSIS — Y9289 Other specified places as the place of occurrence of the external cause: Secondary | ICD-10-CM | POA: Insufficient documentation

## 2015-08-22 LAB — PREPARE FRESH FROZEN PLASMA
UNIT DIVISION: 0
Unit division: 0

## 2015-08-22 LAB — TYPE AND SCREEN
ABO/RH(D): B POS
Antibody Screen: NEGATIVE
UNIT DIVISION: 0
Unit division: 0

## 2015-08-22 MED ORDER — TETANUS-DIPHTH-ACELL PERTUSSIS 5-2.5-18.5 LF-MCG/0.5 IM SUSP
0.5000 mL | Freq: Once | INTRAMUSCULAR | Status: AC
  Administered 2015-08-22: 0.5 mL via INTRAMUSCULAR
  Filled 2015-08-22: qty 0.5

## 2015-08-22 MED ORDER — IOHEXOL 300 MG/ML  SOLN
75.0000 mL | Freq: Once | INTRAMUSCULAR | Status: AC | PRN
Start: 2015-08-22 — End: 2015-08-22
  Administered 2015-08-22: 75 mL via INTRAVENOUS

## 2015-08-22 MED ORDER — CEPHALEXIN 500 MG PO CAPS: 500.0000 mg | ORAL_CAPSULE | Freq: Four times a day (QID) | ORAL | Status: AC

## 2015-08-22 MED ORDER — HYDROCODONE-ACETAMINOPHEN 5-325 MG PO TABS: 2.0000 | ORAL_TABLET | ORAL | Status: DC | PRN
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
## 2015-08-22 NOTE — ED Notes (Signed)
Pt has appeared in no distress, speaking and texting on his phone since arrival.  Pt is now speaking with [employer] officer
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
## 2015-08-22 NOTE — ED Notes (Signed)
See trauma narrator 
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
## 2015-08-23 ENCOUNTER — Encounter (HOSPITAL_COMMUNITY): Payer: Self-pay | Admitting: Emergency Medicine

## 2015-08-23 LAB — ABO/RH: ABO/RH(D): B POS

## 2015-08-23 MED ORDER — HYDROCODONE-ACETAMINOPHEN 5-325 MG PO TABS
1.0000 | ORAL_TABLET | Freq: Once | ORAL | Status: AC
  Administered 2015-08-23: 1 via ORAL
  Filled 2015-08-23: qty 1

## 2016-09-13 ENCOUNTER — Ambulatory Visit: Payer: Self-pay | Admitting: Family Medicine
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
## 2019-10-23 ENCOUNTER — Other Ambulatory Visit: Payer: Self-pay

## 2019-10-23 ENCOUNTER — Encounter (HOSPITAL_COMMUNITY): Payer: Self-pay

## 2019-10-23 ENCOUNTER — Ambulatory Visit (HOSPITAL_COMMUNITY)
Admission: EM | Admit: 2019-10-23 | Discharge: 2019-10-23 | Disposition: A | Discharge status: Home / Self Care | Payer: BC Managed Care – PPO | Attending: Physician Assistant | Admitting: Physician Assistant

## 2019-10-23 DIAGNOSIS — N342 Other urethritis: Secondary | ICD-10-CM | POA: Insufficient documentation

## 2019-10-23 DIAGNOSIS — Z202 Contact with and (suspected) exposure to infections with a predominantly sexual mode of transmission: Secondary | ICD-10-CM | POA: Insufficient documentation

## 2019-10-23 MED ORDER — DOXYCYCLINE HYCLATE 100 MG PO CAPS: 100.0000 mg | ORAL_CAPSULE | Freq: Two times a day (BID) | ORAL | 0 refills | Status: AC

## 2019-10-23 MED ORDER — CEFTRIAXONE SODIUM 500 MG IJ SOLR
INTRAMUSCULAR | Status: AC
  Filled 2019-10-23: qty 500

## 2019-10-23 MED ORDER — METRONIDAZOLE 500 MG PO TABS
2000.0000 mg | ORAL_TABLET | Freq: Once | ORAL | Status: AC
  Administered 2019-10-23: 2000 mg via ORAL

## 2019-10-23 MED ORDER — METRONIDAZOLE 500 MG PO TABS
ORAL_TABLET | ORAL | Status: AC
Start: 2019-10-23 — End: ?
  Filled 2019-10-23: qty 4

## 2019-10-23 MED ORDER — CEFTRIAXONE SODIUM 500 MG IJ SOLR
500.0000 mg | Freq: Once | INTRAMUSCULAR | Status: AC
  Administered 2019-10-23: 500 mg via INTRAMUSCULAR

## 2019-10-23 MED ORDER — LIDOCAINE HCL (PF) 1 % IJ SOLN
INTRAMUSCULAR | Status: AC
  Filled 2019-10-23: qty 2

## 2019-10-23 NOTE — Discharge Instructions (Signed)
We have sent your swab and will notify you of any test results requiring additional treatment or changes in your treatment Otherwise as we found in your MyChart  Take the doxycycline twice a day for 7 days  Abstain from sexual intercourse until you have received all of your test results and completed any and all treatments.  I recommend you follow-up and have the blood work STI testing that we have discussed, at some point.  You may do this with the health department free of charge.

## 2019-10-23 NOTE — ED Triage Notes (Signed)
Pt c/o drainage from penis since yesterday, burning on urination as well. Pt reports his partner notified him that she had gonorrhea and trichomonas. Denies fever, chills, n/v/d.

## 2019-10-23 NOTE — ED Provider Notes (Signed)
[facility]    CSN: [id number] Arrival date & time: 10/23/19  1138      History   Chief Complaint Chief Complaint  Patient presents with  . Exposure to STD    HPI Zandyr Barnhill is a 39 y.o. male.   Patient presents for evaluation of painful urination and penile discharge.  He reports the symptoms started yesterday.Marland Kitchen  He also reports he was notified by a recent partner that they tested positive for gonorrhea and trichomonas.  He was instructed to come get tested and treatment.  He denies any testicular pain, abdominal pain, fever or chills.   Patient does not wish to have blood work testing for HIV and syphilis.  Denies any penile lesions.     Past Medical History:  Diagnosis Date  . Asthma    as a child  . Gonorrhea     There are no problems to display for this patient.   History reviewed. No pertinent surgical history.     Home Medications    Prior to Admission medications   Medication Sig Start Date End Date Taking? Authorizing Provider  doxycycline (VIBRAMYCIN) 100 MG capsule Take 1 capsule (100 mg total) by mouth 2 (two) times daily for 7 days. 10/23/19 10/30/19  Lillianne Eick, Veryl Speak, PA-C    Family History Family History  Problem Relation Age of Onset  . Diabetes Father   . Diabetes Other     Social History Social History   Tobacco Use  . Smoking status: Never Smoker  Substance Use Topics  . Alcohol use: No    Comment: 75 oz /day  . Drug use: No     Allergies   Patient has no known allergies.   Review of Systems Review of Systems Per HPI  Physical Exam Triage Vital Signs ED Triage Vitals  Enc Vitals Group     BP 10/23/19 1222 (!) 147/90     Pulse Rate 10/23/19 1222 100     Resp 10/23/19 1222 18     Temp 10/23/19 1222 98.4 F (36.9 C)     Temp Source 10/23/19 1222 Oral     SpO2 10/23/19 1222 98 %     Weight --      Height --      Head Circumference --      Peak Flow --      Pain Score 10/23/19 1221 2     Pain Loc --       Pain Edu? --      Excl. in GC? --    No data found.  Updated Vital Signs BP (!) 147/90 (BP Location: Left Arm)   Pulse 100   Temp 98.4 F (36.9 C) (Oral)   Resp 18   SpO2 98%   Visual Acuity Right Eye Distance:   Left Eye Distance:   Bilateral Distance:    Right Eye Near:   Left Eye Near:    Bilateral Near:     Physical Exam Vitals and nursing note reviewed.  Constitutional:      Appearance: He is well-developed.  Cardiovascular:     Rate and Rhythm: Normal rate.  Pulmonary:     Effort: Pulmonary effort is normal. No respiratory distress.  Genitourinary:    Comments: No penile skin lesions or testicular skin lesions.  No testicular swelling, mass or tenderness.  There is urethral irritation with slight discharge.  This was sampled with swab.  No penile tenderness. Musculoskeletal:     Cervical back: Neck supple.  Skin:    General: Skin is warm and dry.  Neurological:     Mental Status: He is alert.      UC Treatments / Results  Labs (all labs ordered are listed, but only abnormal results are displayed) Labs Reviewed  CYTOLOGY, (ORAL, ANAL, URETHRAL) ANCILLARY ONLY    EKG   Radiology No results found.  Procedures Procedures (including critical care time)  Medications Ordered in UC Medications  cefTRIAXone (ROCEPHIN) injection 500 mg (has no administration in time range)  metroNIDAZOLE (FLAGYL) tablet 2,000 mg (has no administration in time range)    Initial Impression / Assessment and Plan / UC Course  I have reviewed the triage vital signs and the nursing notes.  Pertinent labs & imaging results that were available during my care of the patient were reviewed by me and considered in my medical decision making (see chart for details).     #Urethritis #Exposure to gonorrhea #Trichomonas exposure Patient is a 39 year old with urethritis symptoms with recent gonorrhea and trichomonas exposure.  treated with Rocephin 500 mg and 2 g metronidazole  in clinic.  Discharge with doxycycline twice daily for 7 days.  Swab sent for gonorrhea, chlamydia and trichomonas.  Return follow-up precautions were discussed.  Encourage patient to seek out HIV and syphilis testing at some point, with the health department.  He agrees.  Final Clinical Impressions(s) / UC Diagnoses   Final diagnoses:  Urethritis  Exposure to gonorrhea  Trichomonas exposure     Discharge Instructions     We have sent your swab and will notify you of any test results requiring additional treatment or changes in your treatment Otherwise as we found in your MyChart  Take the doxycycline twice a day for 7 days  Abstain from sexual intercourse until you have received all of your test results and completed any and all treatments.  I recommend you follow-up and have the blood work STI testing that we have discussed, at some point.  You may do this with the health department free of charge.      ED Prescriptions    Medication Sig Dispense Auth. Provider   doxycycline (VIBRAMYCIN) 100 MG capsule Take 1 capsule (100 mg total) by mouth 2 (two) times daily for 7 days. 14 capsule Taliesin Hartlage, Marguerita Beards, PA-C     PDMP not reviewed this encounter.   Purnell Shoemaker, PA-C 10/23/19 1256

## 2019-10-26 LAB — CYTOLOGY, (ORAL, ANAL, URETHRAL) ANCILLARY ONLY
Chlamydia: NEGATIVE
Comment: NEGATIVE
Comment: NEGATIVE
Comment: NORMAL
Neisseria Gonorrhea: POSITIVE — AB
Trichomonas: NEGATIVE
